# Patient Record
Sex: Female | Born: 2005 | Race: Black or African American | Hispanic: No | Marital: Single | Smoking: Never smoker
Health system: Southern US, Community
[De-identification: ages and names within clinical notes are randomized; demographics above are authoritative.]

## PROBLEM LIST (undated history)

## (undated) DIAGNOSIS — L309 Dermatitis, unspecified: Secondary | ICD-10-CM

## (undated) DIAGNOSIS — E119 Type 2 diabetes mellitus without complications: Secondary | ICD-10-CM

## (undated) HISTORY — DX: Dermatitis, unspecified: L30.9

## (undated) HISTORY — DX: Type 2 diabetes mellitus without complications: E11.9

---
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
## 2024-01-14 ENCOUNTER — Encounter: Payer: Self-pay | Admitting: Endocrinology

## 2024-01-14 ENCOUNTER — Ambulatory Visit (INDEPENDENT_AMBULATORY_CARE_PROVIDER_SITE_OTHER): Admitting: Endocrinology

## 2024-01-14 ENCOUNTER — Ambulatory Visit: Payer: Self-pay | Admitting: Endocrinology

## 2024-01-14 ENCOUNTER — Other Ambulatory Visit

## 2024-01-14 VITALS — BP 90/62 | HR 73 | Resp 16 | Ht 66.0 in | Wt 128.8 lb

## 2024-01-14 DIAGNOSIS — E109 Type 1 diabetes mellitus without complications: Secondary | ICD-10-CM | POA: Diagnosis not present

## 2024-01-14 LAB — POCT GLYCOSYLATED HEMOGLOBIN (HGB A1C): Hemoglobin A1C: 6.6 % — AB (ref 4.0–5.6)

## 2024-01-14 MED ORDER — DEXCOM G7 SENSOR MISC: 3 refills | Status: AC

## 2024-01-14 MED ORDER — INSULIN LISPRO 100 UNIT/ML IJ SOLN: INTRAMUSCULAR | 5 refills | Status: AC

## 2024-01-14 NOTE — Progress Notes (Signed)
 "  Outpatient Endocrinology Note Harlie Buening, MD   Patient's Name: Debra Brewer    DOB: 03-18-2005    MRN: [id number]                                                    REASON OF VISIT: New consult for type 1 diabetes mellitus  REFERRING PROVIDER: Margarete Golds, MD   PCP: Darrol Merck, MD  HISTORY OF PRESENT ILLNESS:   Debra Brewer is a 19 y.o. old female with past medical history listed below, is here for new consult for type 1 diabetes mellitus.   Pertinent Diabetes History: Patient is transitioning from pediatric to adult endocrinology for type 1 diabetes mellitus.  Initial visit on January 14, 2024.  Patient was diagnosed with type 1 diabetes mellitus in June 2022, initially presented with DKA, hemoglobin A1c at the time of diagnosis was > 15.5%.  She had significantly elevated zinc transporter 8, GAD 65 and insulin  antibodies checked in March 2023, consistent with type 1 diabetes mellitus.   Latest Reference Range & Units 03/08/21 08:21  IA-2 Antibody <5.4 U/mL <5.4  ZNT8 Antibodies <15 U/mL >500 (H)  Insulin  Antibodies, Human <0.4 U/mL 38.7 (H)  Glutamic Acid Decarb Ab <5 IU/mL >250 (H)  (H): Data is abnormally high  She was initially treated with multidose insulin  regimen.  Insulin  pump tandem t:slim X2 control IQ was restarted in April 2023.  Patient has controlled type 1 diabetes mellitus.  History of DKA or diabetes related hospitalizations: Once in initial diagnosis.  Previous diabetes education: Yes   Family h/o diabetes mellitus: aunt and uncles with type 2 DM.    Chronic Diabetes Complications : Retinopathy: no. Last ophthalmology exam was done on 01/2023, following with ophthalmology regularly.  Nephropathy: no Peripheral neuropathy: no Coronary artery disease: no Stroke: no  Relevant comorbidities and cardiovascular risk factors: Obesity: no Body mass index is 20.79 kg/m.  Hypertension: no Hyperlipidemia : no  Current / Home Diabetic  regimen includes:  Tandem t:slim insulin  pump control IQ with Dexcom G7.  Insulin  Pump setting: Basal 1 mostly active. Basal MN- 0.56u/hour  Bolus CHO Ratio (1unit:CHO) MN- 1:10  Correction/Sensitivity: MN- 1:50  Target: 110  Active insulin  time: 5 hours   Insulin  Pump setting: Basal 2 Basal MN- 0.60 u/hour  Bolus CHO Ratio (1unit:CHO) MN- 1:8  Correction/Sensitivity: MN- 1:40  Prior diabetic medications: Multidose insulin  regimen.  She had used Fiasp  into the insulin  pump, had ? technical issues.  CONTINUOUS GLUCOSE MONITORING SYSTEM (CGMS) / INSULIN  PUMP INTERPRETATION:                         Tandem Pump & Dexcom G7 Sensor Download (Reviewed and summarized below.) Pump: Dexcom G6 and Tandem t:slim Dates: December 10 to January 14, 2024, 28 days.  Glucose Management Indicator: 6.6% CGM/Sensor usage:96% Time in range :80%  Glycemic Trends:      Average daily carbs entered: 137 g Average total daily insulin :  30 units, Basal: 40%, Bolus: 60%,  (Manual Bolus: 61%,  Control IQ Bolus: 39%)   Control IQ Time in Use: 89%   Trends:  Mostly acceptable blood sugar.  Occasional hyperglycemia with blood sugar up to 250s range related to high carb meal, timing of total late meal bolus and rarely missing meal bolus.  No  concerning hypoglycemia.  Blood sugar acceptable overnight and in between the meals.  She has been using sleep mode during night.  Hypoglycemia: Patient has no hypoglycemic episodes. Patient has hypoglycemia awareness, has a glucagon  ER kit and diabetic alert device.     Factors modifying glucose control: 1.  Diabetic diet assessment: 3 meals a day.  2.  Staying active or exercising:   3.  Medication compliance: compliant all of the time.  Interval history  Initial visit today.  Transitioning care from pediatric endocrinology.  CGM and pump data as reviewed above.  She is mostly using basal 1 pump setting.  Mostly acceptable blood sugar.   Hemoglobin A1c 6.6% today.  No other complaints today.  REVIEW OF SYSTEMS As per history of present illness.   PAST MEDICAL HISTORY: Past Medical History:  Diagnosis Date   Diabetes mellitus without complication (HCC)    DKA (diabetic ketoacidosis) (HCC) 06/19/2020   Eczema     PAST SURGICAL HISTORY: History reviewed. No pertinent surgical history.  ALLERGIES: Allergies[1]  FAMILY HISTORY:  Family History  Problem Relation Age of Onset   Cancer Maternal Grandmother    Alcohol abuse Neg Hx    Arthritis Neg Hx    Asthma Neg Hx    Birth defects Neg Hx    COPD Neg Hx    Depression Neg Hx    Diabetes Neg Hx    Drug abuse Neg Hx    Early death Neg Hx    Hearing loss Neg Hx    Heart disease Neg Hx    Hyperlipidemia Neg Hx    Hypertension Neg Hx    Kidney disease Neg Hx    Learning disabilities Neg Hx    Mental illness Neg Hx    Mental retardation Neg Hx    Miscarriages / Stillbirths Neg Hx    Stroke Neg Hx    Vision loss Neg Hx    Varicose Veins Neg Hx     SOCIAL HISTORY: Social History   Socioeconomic History   Marital status: Single    Spouse name: Not on file   Number of children: Not on file   Years of education: Not on file   Highest education level: Not on file  Occupational History   Not on file  Tobacco Use   Smoking status: Never   Smokeless tobacco: Never  Vaping Use   Vaping status: Never Used  Substance and Sexual Activity   Alcohol use: Never   Drug use: Never   Sexual activity: Not on file  Other Topics Concern   Not on file  Social History Narrative   She lives with mom, step dad, brother and sister, 1 dog   College [employer] (2025 - 2026) summer break major english and nurse, children's, minor in doctor, hospital and sociology    She enjoys reading, writing and listen to music    Social Drivers of Health   Tobacco Use: Low Risk (01/14/2024)   Patient History    Smoking Tobacco Use: Never    Smokeless Tobacco Use: Never    Passive Exposure: Not  on file  Financial Resource Strain: Not on file  Food Insecurity: Not on file  Transportation Needs: Not on file  Physical Activity: Not on file  Stress: Not on file  Social Connections: Not on file  Depression (EYV7-0): Not on file  Alcohol Screen: Not on file  Housing: Not on file  Utilities: Not on file  Health Literacy: Not on file    MEDICATIONS:  Current Outpatient Medications  Medication Sig Dispense Refill   Blood Glucose Monitoring Suppl (ONETOUCH VERIO FLEX SYSTEM) w/Device KIT Use as directed to check glucose. 1 kit 1   Glucagon  (BAQSIMI  TWO PACK) 3 MG/DOSE POWD Insert into nare and spray prn severe hypoglycemia and unresponsiveness 1 each 3   glucose blood (ONETOUCH VERIO) test strip USE AS DIRECTED TO CHECK GLUCOSE 6 X/DAY. 200 strip 5   insulin  degludec (TRESIBA  FLEXTOUCH) 100 UNIT/ML FlexTouch Pen INJECT UP TO 50 UNITS ONCE DAILY AS INSTRUCTED in case of pump failure. 45 mL 1   Insulin  Pen Needle (BD PEN NEEDLE NANO 2ND GEN) 32G X 4 MM MISC Use to inject insulin  6x/day. 200 each 5   Lancet Devices (ONE TOUCH DELICA LANCING DEV) MISC Use to check blood glucose 6 times per day 1 each 1   Lancet Devices (SIMPLE DIAGNOSTICS LANCING DEV) MISC      levonorgestrel (MIRENA) 20 MCG/DAY IUD 1 each by Intrauterine route once.     ondansetron  (ZOFRAN -ODT) 8 MG disintegrating tablet Take 1 tablet (8 mg total) by mouth every 8 (eight) hours as needed for nausea or vomiting. 20 tablet 1   OneTouch Delica Lancets 33G MISC Use as directed to check glucose 6x/day. 200 each 5   Continuous Glucose Sensor (DEXCOM G7 SENSOR) MISC Use as directed every 10 days. 9 each 3   insulin  lispro (HUMALOG ) 100 UNIT/ML injection Inject up to 75 units into insulin  pump every day. Please fill for VIAL. 30 mL 5   No current facility-administered medications for this visit.    PHYSICAL EXAM: Vitals:   01/14/24 1522  BP: 90/62  Pulse: 73  Resp: 16  SpO2: 96%  Weight: 128 lb 12.8 oz (58.4 kg)   Height: 5' 6 (1.676 m)   Body mass index is 20.79 kg/m.  Wt Readings from Last 3 Encounters:  01/14/24 128 lb 12.8 oz (58.4 kg) (52%, Z= 0.06)*  09/23/23 128 lb 12.8 oz (58.4 kg) (54%, Z= 0.10)*  07/08/23 125 lb (56.7 kg) (48%, Z= -0.06)*   * Growth percentiles are based on CDC (Girls, 2-20 Years) data.    General: Well developed, well nourished female in no apparent distress.  HEENT: AT/[place], no external lesions.  Eyes: Conjunctiva clear and no icterus. Neck: Neck supple  Lungs: Respirations not labored Neurologic: Alert, oriented, normal speech Extremities / Skin: Dry.  Psychiatric: Does not appear depressed or anxious  Diabetic Foot Exam - Simple   Simple Foot Form Diabetic Foot exam was performed with the following findings: Yes 01/14/2024  3:32 PM  Visual Inspection No deformities, no ulcerations, no other skin breakdown bilaterally: Yes Sensation Testing Intact to touch and monofilament testing bilaterally: Yes Pulse Check Posterior Tibialis and Dorsalis pulse intact bilaterally: Yes Comments    LABS Reviewed Lab Results  Component Value Date   HGBA1C 6.6 (A) 01/14/2024   HGBA1C 6.8 (A) 09/23/2023   HGBA1C 7.1 (A) 07/08/2023   No results found for: FRUCTOSAMINE Lab Results  Component Value Date   CHOL 147 08/13/2022   HDL 59 08/13/2022   LDLCALC 74 08/13/2022   TRIG 55 08/13/2022   CHOLHDL 2.5 08/13/2022   Lab Results  Component Value Date   MICRALBCREAT 3 03/08/2021   Lab Results  Component Value Date   CREATININE 0.88 10/16/2022   No results found for: GFR  ASSESSMENT / PLAN  1. Controlled diabetes mellitus type 1 without complications (HCC)    Diabetes Mellitus type 2, complicated by no known complications. -  Diabetic status / severity: Controlled.  Lab Results  Component Value Date   HGBA1C 6.6 (A) 01/14/2024    - Hemoglobin A1c goal <6.5%   Controlled type 1 diabetes mellitus.  Discussed about potential chronic diabetic  complications including diabetic retinopathy, neuropathy and nephropathy.  - Medications:   Insulin  pump setting changed as follows: Insulin  Pump setting: No change on the pump setting today.    She mostly used basal 1 pump setting as mentioned above.  If blood sugar remained high she changes pump setting to basal 2 which has stronger basal /bolus settings.  Advised patient to update clinic and provider for pump supplies refill to diabetes supplier.  - Home glucose testing: continue CGM and check blood glucose as needed.  - Discussed/ Gave Hypoglycemia treatment plan.  # Consult : not required at this time.   # Annual urine for microalbuminuria/ creatinine ratio, no microalbuminuria currently, will check today along with BMP. Last  Lab Results  Component Value Date   MICRALBCREAT 3 03/08/2021    # Foot check nightly.  # Annual dilated diabetic eye exams.   - Diet: Make healthy diabetic food choices - Life style / activity / exercise: Discussed.  2. Blood pressure  -  BP Readings from Last 1 Encounters:  01/14/24 90/62    - Control is in target.  - No change in current plans.  3. Lipid status / Hyperlipidemia - Last  Lab Results  Component Value Date   LDLCALC 74 08/13/2022   - No indication of statin at this time.  Diagnoses and all orders for this visit:  Controlled diabetes mellitus type 1 without complications (HCC) -     POCT glycosylated hemoglobin (Hb A1C) -     Microalbumin / creatinine urine ratio -     Basic metabolic panel with GFR -     Continuous Glucose Sensor (DEXCOM G7 SENSOR) MISC; Use as directed every 10 days. -     insulin  lispro (HUMALOG ) 100 UNIT/ML injection; Inject up to 75 units into insulin  pump every day. Please fill for VIAL.    DISPOSITION Follow up in clinic in 4 months suggested.  Labs today as ordered.   All questions answered and patient verbalized understanding of the plan.  Zhamir Pirro, MD [facility] Endocrinology [facility] [address] [place], [address] [place], [place] [address] Phone # [phone]  At least part of this note was generated using voice recognition software. Inadvertent word errors may have occurred, which were not recognized during the proofreading process.     [1]  Allergies Allergen Reactions   Pistachio Nut (Diagnostic) Hives and Itching   "

## 2024-01-15 LAB — MICROALBUMIN / CREATININE URINE RATIO
Creatinine, Urine: 208 mg/dL (ref 20–275)
Microalb Creat Ratio: 2 mg/g{creat}
Microalb, Ur: 0.5 mg/dL

## 2024-01-15 LAB — BASIC METABOLIC PANEL WITH GFR
BUN: 15 mg/dL (ref 7–20)
CO2: 24 mmol/L (ref 20–32)
Calcium: 9.4 mg/dL (ref 8.9–10.4)
Chloride: 106 mmol/L (ref 98–110)
Creat: 0.83 mg/dL (ref 0.50–0.96)
Glucose, Bld: 120 mg/dL — ABNORMAL HIGH (ref 65–99)
Potassium: 4 mmol/L (ref 3.8–5.1)
Sodium: 138 mmol/L (ref 135–146)
eGFR: 104 mL/min/1.73m2

## 2024-02-06 ENCOUNTER — Telehealth: Admitting: Nurse Practitioner

## 2024-02-06 DIAGNOSIS — F330 Major depressive disorder, recurrent, mild: Secondary | ICD-10-CM | POA: Insufficient documentation

## 2024-02-06 DIAGNOSIS — F419 Anxiety disorder, unspecified: Secondary | ICD-10-CM | POA: Insufficient documentation

## 2024-02-06 NOTE — Assessment & Plan Note (Signed)
 SABRA

## 2024-02-06 NOTE — Progress Notes (Unsigned)
" ° °  Subjective:  No chief complaint on file.    HPI: Debra Brewer is a 19 y.o. female presenting on 02/06/2024 via telehealth for medication follow up.  She is taking 18 credits this semester while working toward a degree in English/economics. 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 She reports appetite and sleep are good. Denies any sadness, loneliness, irritability, tension, loss of interest, or suicidal/homicidal ideation.     ROS: Negative unless specifically indicated above in HPI.   Relevant past medical history reviewed and updated as indicated.   Allergies and medications reviewed and updated.   Current Outpatient Medications  Medication Instructions   Blood Glucose Monitoring Suppl (ONETOUCH VERIO FLEX SYSTEM) w/Device KIT Use as directed to check glucose.   Continuous Glucose Sensor (DEXCOM G7 SENSOR) MISC Use as directed every 10 days.   Glucagon  (BAQSIMI  TWO PACK) 3 MG/DOSE POWD Insert into nare and spray prn severe hypoglycemia and unresponsiveness   glucose blood (ONETOUCH VERIO) test strip USE AS DIRECTED TO CHECK GLUCOSE 6 X/DAY.   insulin  degludec (TRESIBA  FLEXTOUCH) 100 UNIT/ML FlexTouch Pen INJECT UP TO 50 UNITS ONCE DAILY AS INSTRUCTED in case of pump failure.   insulin  lispro (HUMALOG ) 100 UNIT/ML injection Inject up to 75 units into insulin  pump every day. Please fill for VIAL.   Insulin  Pen Needle (BD PEN NEEDLE NANO 2ND GEN) 32G X 4 MM MISC Use to inject insulin  6x/day.   Lancet Devices (ONE TOUCH DELICA LANCING DEV) MISC Use to check blood glucose 6 times per day   Lancet Devices (SIMPLE DIAGNOSTICS LANCING DEV) MISC    levonorgestrel (MIRENA) 20 MCG/DAY IUD 1 each,  Once   ondansetron  (ZOFRAN -ODT) 8 mg, Oral, Every 8 hours PRN   OneTouch Delica Lancets 33G MISC Use as directed to check glucose 6x/day.      Allergies[1]  Objective:   There were no vitals taken for this visit.   Physical Exam Constitutional:      Appearance: Normal appearance.  HENT:     Head: Normocephalic.  Eyes:     Conjunctiva/sclera: Conjunctivae normal.  Cardiovascular:     Rate and Rhythm: Normal rate and regular rhythm.  Pulmonary:     Effort: Pulmonary effort is normal.     Breath sounds: Normal breath sounds.  Skin:    General: Skin is warm and dry.  Neurological:     General: No focal deficit present.     Mental Status: She is alert and oriented to person, place, and time.  Psychiatric:        Mood and Affect: Mood normal.        Behavior: Behavior normal.        Thought Content: Thought content normal.        Judgment: Judgment normal.        Assessment & Plan:   Assessment & Plan Mild episode of recurrent major depressive disorder     Anxiety        Follow up plan: No follow-ups on file.  Florencia Cousin, NP     [1]  Allergies Allergen Reactions   Pistachio Nut (Diagnostic) Hives and Itching   "

## 2024-02-06 NOTE — Assessment & Plan Note (Signed)
 Debra Brewer

## 2024-04-23 ENCOUNTER — Telehealth: Admitting: Nurse Practitioner

## 2024-05-12 ENCOUNTER — Ambulatory Visit: Admitting: Endocrinology
